# Patient Record
Sex: Female | Born: 1955 | Smoking: Current every day smoker
Health system: Southern US, Community
[De-identification: ages and names within clinical notes are randomized; demographics above are authoritative.]

## PROBLEM LIST (undated history)

## (undated) DIAGNOSIS — R296 Repeated falls: Secondary | ICD-10-CM

## (undated) DIAGNOSIS — I10 Essential (primary) hypertension: Secondary | ICD-10-CM

## (undated) DIAGNOSIS — B20 Human immunodeficiency virus [HIV] disease: Secondary | ICD-10-CM

## (undated) DIAGNOSIS — E039 Hypothyroidism, unspecified: Secondary | ICD-10-CM

## (undated) DIAGNOSIS — I739 Peripheral vascular disease, unspecified: Secondary | ICD-10-CM

## (undated) DIAGNOSIS — R5381 Other malaise: Secondary | ICD-10-CM

## (undated) HISTORY — DX: Peripheral vascular disease, unspecified: I73.9

## (undated) HISTORY — DX: Hypothyroidism, unspecified: E03.9

## (undated) HISTORY — DX: Human immunodeficiency virus (HIV) disease: B20

## (undated) HISTORY — DX: Other malaise: R53.81

## (undated) HISTORY — PX: CEREBRAL ANEURYSM REPAIR: SHX164

## (undated) HISTORY — DX: Repeated falls: R29.6

## (undated) HISTORY — DX: Essential (primary) hypertension: I10

---

## 2016-10-09 LAB — PROTIME-INR: PROTIME: 14.1 — AB (ref 10.0–13.8)

## 2016-10-09 LAB — POCT INR: INR: 1.1 (ref 0.9–1.1)

## 2016-11-27 LAB — BASIC METABOLIC PANEL WITH GFR
BUN: 14 (ref 4–21)
Creatinine: 0.5 (ref 0.5–1.1)
Potassium: 4.1 (ref 3.4–5.3)
Sodium: 140 (ref 137–147)

## 2016-11-29 LAB — HEPATIC FUNCTION PANEL
ALT: 16 (ref 7–35)
AST: 21 (ref 13–35)
Alkaline Phosphatase: 60 (ref 25–125)
BILIRUBIN, TOTAL: 0.3

## 2016-11-29 LAB — CBC AND DIFFERENTIAL
HCT: 43 (ref 36–46)
Hemoglobin: 14.6 (ref 12.0–16.0)
PLATELETS: 162 (ref 150–399)
WBC: 7.5

## 2016-12-03 ENCOUNTER — Non-Acute Institutional Stay (SKILLED_NURSING_FACILITY): Payer: Medicare HMO | Admitting: Internal Medicine

## 2016-12-03 ENCOUNTER — Encounter: Payer: Self-pay | Admitting: Internal Medicine

## 2016-12-03 DIAGNOSIS — I10 Essential (primary) hypertension: Secondary | ICD-10-CM

## 2016-12-03 DIAGNOSIS — S93401D Sprain of unspecified ligament of right ankle, subsequent encounter: Secondary | ICD-10-CM | POA: Diagnosis not present

## 2016-12-03 DIAGNOSIS — E8809 Other disorders of plasma-protein metabolism, not elsewhere classified: Secondary | ICD-10-CM

## 2016-12-03 DIAGNOSIS — S93401A Sprain of unspecified ligament of right ankle, initial encounter: Secondary | ICD-10-CM | POA: Insufficient documentation

## 2016-12-03 DIAGNOSIS — G6289 Other specified polyneuropathies: Secondary | ICD-10-CM | POA: Insufficient documentation

## 2016-12-03 DIAGNOSIS — B20 Human immunodeficiency virus [HIV] disease: Secondary | ICD-10-CM | POA: Diagnosis not present

## 2016-12-03 DIAGNOSIS — E039 Hypothyroidism, unspecified: Secondary | ICD-10-CM | POA: Insufficient documentation

## 2016-12-03 NOTE — Assessment & Plan Note (Addendum)
Ambulate with walker as precaution PT/OT for strength & balance training

## 2016-12-03 NOTE — Progress Notes (Signed)
NURSING HOME LOCATION:  [facility] ROOM NUMBER:  224-A  CODE STATUS:  Full Code  PCP:  No primary care provider on file.    This is a comprehensive admission note to [facility] performed on this date less than 30 days from date of admission. Included are preadmission medical/surgical history;reconciled medication list; family history; social history and comprehensive review of systems.  Corrections and additions to the records were documented . Comprehensive physical exam was also performed. Additionally a clinical summary was entered for each active diagnosis pertinent to this admission in the Problem List to enhance continuity of care.  HPI: The patient was hospitalized 7/31-11/29/16 after mechanical fall described as " foot gave out" with subsequent pain  in the right foot and ankle pain precluding ambulation. She denies any cardiac or neurologic prodrome. She states that she was actually reaching for her walker when her leg slipped out from under her.  As she must employ a walker at home and her husband must use a cane for ambulation, she was admitted for evaluation. No fracture or dislocation was found on imaging, diffuse osteopenia was present.Clinically only strain was present. PT/OT evaluated the patient and she was gradually mobilkized with a walker. Patient has a diagnosis of axonal neuropathy;Neurology recommended continuing the PT. The etiology of the neuropathy was possibly due to HIV, idiopathic, or diabetes as per neurology.  ACE inhibitor and HCTZ was continued as BP was stable. TSH was therapeutic and no change was made in her L-thyroxine. The patient had an episode of fever;but blood cultures were negative ,white count & UA normal. and chest x-ray revealed no acute process. Chest x-ray did reveal calcified granulomata at the right apex, elevation of left hemidiaphragm with basilar scarring, and chronic interstitial changes which were stable compared to prior  films.ID was to follow the patient as outpatient for HIV .Abnormal labs included a creatinine of 0.45, albumin 3.3 Protein supplement was recommended twice a day for 30 days.  Past medical and surgical history: Includes hypothyroidism, hypertension, osteoarthritis, osteoporosis, HIV, gait instabity with recurrent falls. No surgical history on file,but she states she had ' fluid drawn from her kidney 'during which she had injury to the rib. The details are vague at best. She also states that she had surgery for CNS aneurysm in 1994.This hx should be verified before Epic entry.  Social history: She was smoking a half a pack per day prior to admission. She does not drink.  Family history: Father had diabetes, heart attack, and stroke. Mother had diabetes. Father also had cancer of the extremity.  Review of systems: At this time she describes only residual soreness in the ankle and has been ambulating in PT/OT. She describes intermittent frontal headaches which she feels may be related to needing eyeglasses. She is not treating this with any medication. She does describe occasional heartburn and occasional dysphagia. Additionally she has occasional or intermittent dysuria.  Constitutional: No fever,significant weight change  Eyes: No redness, discharge, pain, vision change ENT/mouth: No nasal congestion,  purulent discharge, earache,change in hearing ,sore throat  Cardiovascular: No chest pain, palpitations,paroxysmal nocturnal dyspnea, claudication, edema  Respiratory: No cough, sputum production,hemoptysis, DOE , significant snoring,apnea  Gastrointestinal: No abdominal pain, nausea / vomiting,rectal bleeding, melena,change in bowels Genitourinary: No hematuria, pyuria,  incontinence, nocturia Dermatologic: No rash, pruritus, change in appearance of skin Neurologic: No dizziness,syncope, seizures, numbness , tingling Psychiatric: No significant anxiety , depression, insomnia, anorexia Endocrine: No  change in hair/skin/ nails, excessive thirst,  excessive hunger, excessive urination  Hematologic/lymphatic: No significant bruising, lymphadenopathy,abnormal bleeding Allergy/immunology: No itchy/ watery eyes, significant sneezing, urticaria, angioedema  Physical exam:  Pertinent or positive findings: Weight appears suboptimal. She has temporal wasting. Eyebrows are absent. There is slight exotropia of the right eye at rest. She is edentulous. She has bilateral carotid bruits. There is a grade 1 systolic murmur at the base. Toenails are thickened. Pedal pulses are decreased.  No significant pain with ROM of R ankle.   General appearance: no acute distress , increased work of breathing is present.   Lymphatic: No lymphadenopathy about the head, neck, axilla . Eyes: No conjunctival inflammation or lid edema is present. There is no scleral icterus. Ears:  External ear exam shows no significant lesions or deformities.   Nose:  External nasal examination shows no deformity or inflammation. Nasal mucosa are pink and moist without lesions ,exudates Oral exam: lips and gums are healthy appearing.There is no oropharyngeal erythema or exudate . Neck:  No thyromegaly, masses, tenderness noted.    Heart:  Normal rate and regular rhythm. S1 and S2 normal without gallop,  click, rub .  Lungs:Chest clear to auscultation without wheezes, rhonchi,rales , rubs. Abdomen:Bowel sounds are normal. Abdomen is soft and nontender with no organomegaly, hernias,masses. GU: deferred  Extremities:  No cyanosis, clubbing,edema.Neurologic exam : Strength equal  in upper & lower extremities Balance,Rhomberg,finger to nose testing could not be completed due to clinical state Deep tendon reflexes are equal Skin: Warm & dry w/o tenting. No significant lesions or rash.  See clinical summary under each active problem in the Problem List with associated updated therapeutic plan

## 2016-12-03 NOTE — Assessment & Plan Note (Signed)
As per ID

## 2016-12-03 NOTE — Assessment & Plan Note (Signed)
Nutritional supplement twice a day 30 days

## 2016-12-03 NOTE — Assessment & Plan Note (Signed)
BP controlled; no change in antihypertensive medications  

## 2016-12-03 NOTE — Assessment & Plan Note (Addendum)
Continue PT/OT Wean & discontinue the opioids due to the subjective improvement If pain or swelling recur, orthopedic consultation

## 2016-12-04 ENCOUNTER — Encounter: Payer: Self-pay | Admitting: Internal Medicine

## 2016-12-04 NOTE — Assessment & Plan Note (Signed)
TSH therapeutic

## 2016-12-04 NOTE — Patient Instructions (Signed)
See assessment and plan under each diagnosis in the problem list and acutely for this visit 

## 2016-12-12 ENCOUNTER — Encounter: Payer: Self-pay | Admitting: Adult Health

## 2016-12-12 ENCOUNTER — Non-Acute Institutional Stay (SKILLED_NURSING_FACILITY): Payer: Medicare HMO | Admitting: Adult Health

## 2016-12-12 DIAGNOSIS — B20 Human immunodeficiency virus [HIV] disease: Secondary | ICD-10-CM

## 2016-12-12 DIAGNOSIS — R2681 Unsteadiness on feet: Secondary | ICD-10-CM

## 2016-12-12 DIAGNOSIS — S93401D Sprain of unspecified ligament of right ankle, subsequent encounter: Secondary | ICD-10-CM

## 2016-12-12 DIAGNOSIS — E039 Hypothyroidism, unspecified: Secondary | ICD-10-CM

## 2016-12-12 DIAGNOSIS — I10 Essential (primary) hypertension: Secondary | ICD-10-CM

## 2016-12-12 MED ORDER — ALENDRONATE SODIUM 70 MG PO TABS: 70.0000 mg | ORAL_TABLET | ORAL | 0 refills | Status: DC

## 2016-12-12 MED ORDER — DICLOFENAC SODIUM 75 MG PO TBEC: 75.0000 mg | DELAYED_RELEASE_TABLET | Freq: Two times a day (BID) | ORAL | 0 refills | Status: DC

## 2016-12-12 MED ORDER — ERGOCALCIFEROL 1.25 MG (50000 UT) PO CAPS: 50000.0000 [IU] | ORAL_CAPSULE | ORAL | 0 refills | Status: DC

## 2016-12-12 MED ORDER — LEVOTHYROXINE SODIUM 75 MCG PO TABS: 75.0000 ug | ORAL_TABLET | Freq: Every day | ORAL | 0 refills | Status: DC

## 2016-12-12 MED ORDER — EMTRICITAB-RILPIVIR-TENOFOV AF 200-25-25 MG PO TABS: 1.0000 | ORAL_TABLET | Freq: Every day | ORAL | 0 refills | Status: AC

## 2016-12-12 MED ORDER — VALACYCLOVIR HCL 500 MG PO TABS: 1000.0000 mg | ORAL_TABLET | Freq: Every day | ORAL | 0 refills | Status: DC

## 2016-12-12 MED ORDER — LISINOPRIL-HYDROCHLOROTHIAZIDE 20-25 MG PO TABS: 1.0000 | ORAL_TABLET | Freq: Every day | ORAL | 0 refills | Status: DC

## 2016-12-12 MED ORDER — DOLUTEGRAVIR SODIUM 50 MG PO TABS: 50.0000 mg | ORAL_TABLET | Freq: Every day | ORAL | 0 refills | Status: AC

## 2016-12-12 MED ORDER — METOPROLOL TARTRATE 25 MG PO TABS: 25.0000 mg | ORAL_TABLET | Freq: Every day | ORAL | 0 refills | Status: DC

## 2016-12-12 NOTE — Progress Notes (Signed)
DATE:  12/12/2016   MRN:  [id number]  BIRTHDAY: [birth year]  Facility:  Nursing Home Location:  [facility] Room Number: 224-A  LEVEL OF CARE:  SNF (31)  Contact Information    Name Relation Home Work Mobile   Phifer,Simon Spouse [phone]         Code Status History    This patient does not have a recorded code status. Please follow your organizational policy for patients in this situation.       Chief Complaint  Patient presents with  . Discharge Note    Discharge visit    HISTORY OF PRESENT ILLNESS:  This is a 60-YO female seen for discharge.  She will discharge to home on 12/13/2016 with home health PT, OT, ST, Social Work and Nursing services.    She has been admitted to [facility] 11/[facility]/16 [facility] admission admission dates 11/26/16 to 11/29/16 for S/P fall sustaining Right ankle sprain. 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   Patient was admitted to this facility for short-term rehabilitation after the patient's recent hospitalization.  Patient has completed SNF rehabilitation and therapy has cleared the patient for discharge.    PAST MEDICAL HISTORY:  Past Medical History:  Diagnosis Date  . Acquired hypothyroidism   . Essential hypertension   . Frequent falls   . HIV (human immunodeficiency virus infection) (HCC)   . Physical debility   . PVD (peripheral vascular disease) (HCC)      CURRENT MEDICATIONS: Reviewed  Patient's Medications  New Prescriptions   No medications on file  Previous Medications   ALENDRONATE (FOSAMAX) 70 MG TABLET    Take 70 mg by mouth once a week. Take with a full glass of water on an empty stomach.   ASPIRIN 81 MG CHEWABLE TABLET    Chew 81 mg by mouth daily.   BUTALBITAL-ACETAMINOPHEN-CAFFEINE (FIORICET, ESGIC) 50-325-40 MG TABLET    Take 1 tablet by mouth every 4 (four) hours as needed for headache.   DICLOFENAC  (VOLTAREN) 75 MG EC TABLET    Take 75 mg by mouth 2 (two) times daily.   DOLUTEGRAVIR (TIVICAY) 50 MG TABLET    Take 50 mg by mouth daily.   EMTRICITABINE-RILPIVIR-TENOFOVIR AF (ODEFSEY) 200-25-25 MG TABS TABLET    Take 1 tablet by mouth daily with breakfast.   ERGOCALCIFEROL (DRISDOL) 50000 UNITS CAPSULE    Take 50,000 Units by mouth every 30 (thirty) days.   LEVOTHYROXINE (SYNTHROID, LEVOTHROID) 75 MCG TABLET    Take 75 mcg by mouth daily before breakfast.   LISINOPRIL-HYDROCHLOROTHIAZIDE (PRINZIDE,ZESTORETIC) 20-25 MG TABLET    Take 1 tablet by mouth daily.   METOPROLOL TARTRATE (LOPRESSOR) 25 MG TABLET    Take 25 mg by mouth daily.   NUTRITIONAL SUPPLEMENT LIQD    Take 120 mLs by mouth 2 (two) times daily. MedPass   OXYCODONE (OXY-IR) 5 MG CAPSULE    Take 5 mg by mouth. Take one tablet q8h prn thru 12/15/16, take 1 tablet q12h 8/20-8/25 and then discontinue   VALACYCLOVIR (VALTREX) 500 MG TABLET    Take 1,000 mg by mouth daily. Take 2 tablets to = 1000 mg  Modified Medications   No medications on file  Discontinued Medications   No medications on file     Allergies  Allergen Reactions  . Penicillins   . Sulfa Antibiotics     Sulfonamide antibiotics  REVIEW OF SYSTEMS:  GENERAL: no change in appetite, no fatigue, no weight changes, no fever, chills or weakness MOUTH and THROAT: Denies oral discomfort, gingival pain or bleeding, pain from teeth or hoarseness   RESPIRATORY: no cough, SOB, DOE, wheezing, hemoptysis CARDIAC: no chest pain, edema or palpitations GI: no abdominal pain, diarrhea, constipation, heart burn, nausea or vomiting PSYCHIATRIC: Denies feeling of depression or anxiety. No report of hallucinations, insomnia, paranoia, or agitation    PHYSICAL EXAMINATION  GENERAL APPEARANCE:  In no acute distress. Normal body habitus SKIN:  Skin is warm and dry.  HEAD: Normal in size and contour. No evidence of trauma EYES: Lids open and close normally. No blepharitis,  entropion or ectropion.  MOUTH and THROAT: Lips are without lesions. Oral mucosa is moist and without lesions. Tongue is normal in shape, size, and color and without lesions RESPIRATORY: breathing is even & unlabored, BS CTAB CARDIAC: RRR, no edema GI: abdomen soft, normal BS, no masses, no tenderness EXTREMITIES:   Able to move X 4 extremities PSYCHIATRIC: Alert to self and place, disoriented to time. Affect and behavior are appropriate   LABS/RADIOLOGY: Labs reviewed: Basic Metabolic Panel:  Recent Labs  16/10/96  NA 140  K 4.1  BUN 14  CREATININE 0.5   Liver Function Tests:  Recent Labs  11/29/16  AST 21  ALT 16  ALKPHOS 60   CBC:  Recent Labs  11/29/16  WBC 7.5  HGB 14.6  HCT 43  PLT 162     ASSESSMENT/PLAN:  1. Unsteady gait -  For home health PT and OT, for therapeutic strengthening exercises; fall precautions   2. Sprain of right ankle, unspecified ligament, subsequent encounter - For home health PT and OT, for therapeutic strengthening exercises;  continue oxycodone 5 mg 1 tab by mouth every 8 hours when necessary for pain, follow-up with orthopedics   3. HIV (human immunodeficiency virus infection) (HCC) - continue Tivicay, Odefsey and Valacyclovir, follow-up with PCP   4. Acquired hypothyroidism -  Continue levothyroxine 75 g 1 tab daily   5. Essential hypertension -  Well controlled; continue lisinopril-HCTZ 20-25 mg 1 tab daily     I have filled out patient's discharge paperwork and written prescriptions.  Patient will receive home health PT, OT, ST, Nursing and Child psychotherapist.  DME provided: None  Total discharge time: Greater than 30 minutes  150% was spent in counseling and coordination of care.   Discharge time involved coordination of the discharge process with social worker, nursing staff and therapy department. Medical justification for home health services verified.    Gabriana Wilmott C. Medina-Vargas - NP   [employer] [phone]

## 2017-06-03 ENCOUNTER — Other Ambulatory Visit: Payer: Self-pay | Admitting: Adult Health

## 2017-06-11 ENCOUNTER — Other Ambulatory Visit: Payer: Self-pay

## 2017-06-11 ENCOUNTER — Emergency Department (HOSPITAL_BASED_OUTPATIENT_CLINIC_OR_DEPARTMENT_OTHER): Payer: Medicare HMO

## 2017-06-11 ENCOUNTER — Emergency Department (HOSPITAL_BASED_OUTPATIENT_CLINIC_OR_DEPARTMENT_OTHER)
Admission: EM | Admit: 2017-06-11 | Discharge: 2017-06-11 | Disposition: A | Discharge status: Home / Self Care | Payer: Medicare HMO | Attending: Emergency Medicine | Admitting: Emergency Medicine

## 2017-06-11 ENCOUNTER — Encounter (HOSPITAL_BASED_OUTPATIENT_CLINIC_OR_DEPARTMENT_OTHER): Payer: Self-pay | Admitting: Emergency Medicine

## 2017-06-11 DIAGNOSIS — Z79899 Other long term (current) drug therapy: Secondary | ICD-10-CM | POA: Insufficient documentation

## 2017-06-11 DIAGNOSIS — W19XXXA Unspecified fall, initial encounter: Secondary | ICD-10-CM

## 2017-06-11 DIAGNOSIS — Z21 Asymptomatic human immunodeficiency virus [HIV] infection status: Secondary | ICD-10-CM | POA: Insufficient documentation

## 2017-06-11 DIAGNOSIS — F17210 Nicotine dependence, cigarettes, uncomplicated: Secondary | ICD-10-CM | POA: Diagnosis not present

## 2017-06-11 DIAGNOSIS — M25552 Pain in left hip: Secondary | ICD-10-CM | POA: Diagnosis present

## 2017-06-11 DIAGNOSIS — Z7982 Long term (current) use of aspirin: Secondary | ICD-10-CM | POA: Diagnosis not present

## 2017-06-11 DIAGNOSIS — E039 Hypothyroidism, unspecified: Secondary | ICD-10-CM | POA: Diagnosis not present

## 2017-06-11 DIAGNOSIS — M25562 Pain in left knee: Secondary | ICD-10-CM | POA: Diagnosis not present

## 2017-06-11 DIAGNOSIS — I10 Essential (primary) hypertension: Secondary | ICD-10-CM | POA: Diagnosis not present

## 2017-06-11 DIAGNOSIS — W01198A Fall on same level from slipping, tripping and stumbling with subsequent striking against other object, initial encounter: Secondary | ICD-10-CM | POA: Diagnosis not present

## 2017-06-11 MED ORDER — ACETAMINOPHEN ER 650 MG PO TBCR: 650.0000 mg | EXTENDED_RELEASE_TABLET | Freq: Three times a day (TID) | ORAL | 0 refills | Status: DC | PRN

## 2017-06-11 MED ORDER — ACETAMINOPHEN 325 MG PO TABS
650.0000 mg | ORAL_TABLET | Freq: Once | ORAL | Status: AC
Start: 2017-06-11 — End: 2017-06-11
  Administered 2017-06-11: 650 mg via ORAL
  Filled 2017-06-11: qty 2

## 2017-06-11 NOTE — ED Triage Notes (Signed)
Pt to ED via EMS with c/o LT knee pain s/p fall at home

## 2017-06-11 NOTE — ED Notes (Signed)
Pt able to ambulate with walker. PA aware. Pt ready for discharge.

## 2017-06-11 NOTE — ED Provider Notes (Signed)
[facility] EMERGENCY DEPARTMENT Provider Note   CSN: [id number] Arrival date & time: 06/11/17  1639     History   Chief Complaint Chief Complaint  Patient presents with  . Knee Pain    HPI [name] is a 62 y.o. female with a hx of HIV, hypothyroidism, HTN, PVD, and physical debility who arrives to the ED via EMS Sonya Le that occurred at 1400 complaining of LLE pain most prominently in the hip and knee. Patient states she was getting off of the commode and pulling her pants up when she tripped over the pant leg causing her to Le onto the L knee. States she did not have head injury or LOC. Reports she needed her husband's assistance to get up. She has not ambulated since the Le. She walks with a walker at baseline. States pain at present is a 10/10 in severity, worse with movement, no alleviating factors, tried advil. Denies numbness, weakness, neck pain, or back pain. Denies lightheadedness, dizziness, chest pain, or dyspnea prior to Le or at present.   HPI  Past Medical History:  Diagnosis Date  . Acquired hypothyroidism   . Essential hypertension   . Frequent falls   . HIV (human immunodeficiency virus infection) (HCC)   . Physical debility   . PVD (peripheral vascular disease) [facility])     Patient Active Problem List   Diagnosis Date Noted  . Right ankle sprain 12/03/2016  . 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 Hypothyroidism 12/03/2016  . Essential hypertension 12/03/2016  . HIV (human immunodeficiency virus infection) (HCC) 12/03/2016  . Hypoalbuminemia 12/03/2016    Past Surgical History:  Procedure Laterality Date  . CEREBRAL ANEURYSM REPAIR      OB History    No data available       Home Medications    Prior to Admission medications   Medication Sig Start Date End Date Taking? Authorizing Provider  alendronate (FOSAMAX) 70 MG tablet Take 1 tablet (70 mg total) by mouth once a week. Take with a full glass of water on an empty  stomach. 12/12/16   Medina-Vargas, Monina C, NP  aspirin 81 MG chewable tablet Chew 81 mg by mouth daily.    [provider]  butalbital-acetaminophen-caffeine (FIORICET, ESGIC) 50-325-40 MG tablet Take 1 tablet by mouth every 4 (four) hours as needed for headache.    [provider]  diclofenac (VOLTAREN) 75 MG EC tablet Take 1 tablet (75 mg total) by mouth 2 (two) times daily. 12/12/16   Medina-Vargas, Monina C, NP  dolutegravir (TIVICAY) 50 MG tablet Take 1 tablet (50 mg total) by mouth daily. 12/12/16   Medina-Vargas, Monina C, NP  emtricitabine-rilpivir-tenofovir AF (ODEFSEY) 200-25-25 MG TABS tablet Take 1 tablet by mouth daily with breakfast. 12/12/16   Medina-Vargas, Monina C, NP  ergocalciferol (DRISDOL) 50000 units capsule Take 1 capsule (50,000 Units total) by mouth every 30 (thirty) days. 12/12/16   Medina-Vargas, Monina C, NP  levothyroxine (SYNTHROID, LEVOTHROID) 75 MCG tablet Take 1 tablet (75 mcg total) by mouth daily before breakfast. 12/12/16   Medina-Vargas, Monina C, NP  lisinopril-hydrochlorothiazide (PRINZIDE,ZESTORETIC) 20-25 MG tablet Take 1 tablet by mouth daily. 12/12/16   Medina-Vargas, Monina C, NP  metoprolol tartrate (LOPRESSOR) 25 MG tablet Take 1 tablet (25 mg total) by mouth daily. 12/12/16   Medina-Vargas, Monina C, NP  NUTRITIONAL SUPPLEMENT LIQD Take 120 mLs by mouth 2 (two) times daily. MedPass    [provider]  oxycodone (OXY-IR) 5 MG capsule Take  5 mg by mouth. Take one tablet q8h prn thru 12/15/16, take 1 tablet q12h 8/20-8/25 and then discontinue 12/10/16   [provider]  valACYclovir (VALTREX) 500 MG tablet Take 2 tablets (1,000 mg total) by mouth daily. Take 2 tablets to = 1000 mg 12/12/16   Medina-Vargas, Monina C, NP    Family History Family History  Problem Relation Age of Onset  . Diabetes Mother   . Diabetes Father   . Cancer Father   . Stroke Father   . Heart disease Father     Social History Social History    Tobacco Use  . Smoking status: Current Every Day Smoker    Packs/day: 0.50    Types: Cigarettes  . Smokeless tobacco: Never Used  Substance Use Topics  . Alcohol use: No  . Drug use: No     Allergies   Penicillins and Sulfa antibiotics   Review of Systems Review of Systems  Respiratory: Negative for shortness of breath.   Cardiovascular: Negative for chest pain and leg swelling.  Musculoskeletal: Positive for arthralgias (L hip and L knee) and myalgias. Negative for back pain and neck pain.  Neurological: Negative for dizziness, weakness, light-headedness, numbness and headaches.  All other systems reviewed and are negative.  Physical Exam Updated Vital Signs BP (!) 174/70   Pulse 82   Temp 98.2 F (36.8 C) (Oral)   Resp 17   Ht 5' (1.524 m)   Wt 58.5 kg (129 lb)   SpO2 100%   BMI 25.19 kg/m   Physical Exam  Constitutional: No distress.  Thin appearing.   HENT:  Head: Normocephalic and atraumatic. Head is without raccoon's eyes and without Battle's sign.  Right Ear: No hemotympanum.  Left Ear: No hemotympanum.  Mouth/Throat: Uvula is midline and oropharynx is clear and moist.  Eyes: Conjunctivae and EOM are normal. Pupils are equal, round, and reactive to light. Right eye exhibits no discharge. Left eye exhibits no discharge.  Neck: No spinous process tenderness present.  Cardiovascular: Normal rate and regular rhythm.  No murmur heard. Pulses:      Dorsalis pedis pulses are 2+ on the right side, and 2+ on the left side.  Pulmonary/Chest: Breath sounds normal. No respiratory distress. She has no wheezes. She has no rales.  Abdominal: Soft. She exhibits no distension. There is no tenderness.  Musculoskeletal:  Back: No midline tenderness. Upper extremities: Full range of motion at all joints, no bony tenderness. Lower extremities: No obvious deformity, appreciable swelling, ecchymosis, or erythema.  Range of motion to the left hip and knee is limited  secondary to pain.  She is diffusely tender to the left hip, femur, knee, and lower leg region.  There is no point tenderness.   Neurological:  Sensation grossly intact to bilateral lower extremities.  5 out of 5 strength with right lower extremity plantar and dorsiflexion, 4 out of 5 strength with left lower extremity plantar and dorsiflexion-patient states difficult secondary to pain.    Skin: Skin is warm and dry. No rash noted.  Psychiatric: She has a normal mood and affect. Her behavior is normal.  Nursing note and vitals reviewed.  ED Treatments / Results  Labs (all labs ordered are listed, but only abnormal results are displayed) Labs Reviewed - No data to display  EKG  EKG Interpretation None       Radiology Dg Tibia/fibula Left  Result Date: 06/11/2017 CLINICAL DATA:  Le today LEFT hip pain EXAM: LEFT TIBIA AND FIBULA -  2 VIEW COMPARISON:  None. FINDINGS: No evidence of tibial fracture. The ankle joint knee joint appear normal. On the lateral projection there is a indentation along the anterior cortex of the distal fibula above the ankle mortise. IMPRESSION: Questionable cortical irregularity distal fibula. Recommend correlation with point tenderness and consider dedicated ankle films. Electronically Signed   By: Genevive BiStewart  Edmunds M.D.   On: 06/11/2017 19:15   Dg Ankle Complete Left  Result Date: 06/11/2017 CLINICAL DATA:  Le, pain. EXAM: LEFT ANKLE COMPLETE - 3+ VIEW COMPARISON:  Tibia performed earlier today. FINDINGS: No visible acute fracture. Anterior cortical defect in the fibula, likely sequelae of old trauma. Nonaggressive features. IMPRESSION: No acute fracture. Electronically Signed   By: Elsie StainJohn T Curnes M.D.   On: 06/11/2017 20:52   Dg Knee Complete 4 Views Left  Result Date: 06/11/2017 CLINICAL DATA:  Le x today. Lt knee pain entirely with some swelling. No old injury known. EXAM: LEFT KNEE - COMPLETE 4+ VIEW COMPARISON:  None. FINDINGS: No fracture of the  proximal tibia or distal femur. Patella is normal. No joint effusion. IMPRESSION: No fracture or dislocation. Electronically Signed   By: Genevive BiStewart  Edmunds M.D.   On: 06/11/2017 19:11   Dg Hip Unilat With Pelvis 2-3 Views Left  Result Date: 06/11/2017 CLINICAL DATA:  Le x today. Lt hip pain entirely no old injury known. EXAM: DG HIP (WITH OR WITHOUT PELVIS) 2-3V LEFT COMPARISON:  11/23/2015 FINDINGS: Hips are located. No evidence of pelvic fracture or sacral fracture. Dedicated view of the LEFT hip demonstrates no femoral neck fracture. IMPRESSION: No pelvic fracture or LEFT hip fracture. Electronically Signed   By: Genevive BiStewart  Edmunds M.D.   On: 06/11/2017 19:10   Dg Femur Min 2 Views Left  Result Date: 06/11/2017 CLINICAL DATA:  LEFT femur pain EXAM: LEFT FEMUR 2 VIEWS COMPARISON:  None. FINDINGS: No fracture or dislocation of the femur. Knee joint and hip joint are normal IMPRESSION: No fracture or dislocation. Electronically Signed   By: Genevive BiStewart  Edmunds M.D.   On: 06/11/2017 19:16    Procedures Procedures (including critical care time)  Medications Ordered in ED Medications  acetaminophen (TYLENOL) tablet 650 mg (650 mg Oral Given 06/11/17 1956)     Initial Impression / Assessment and Plan / ED Course  I have reviewed the triage vital signs and the nursing notes.  Pertinent labs & imaging results that were available during my care of the patient were reviewed by me and considered in my medical decision making (see chart for details).   Patient presents Sonya Le with LLE pain. Vitals WNL other than elevated BP- no indication of HTN emergency- discussed with the patient the need for recheck by PCP.  Patient without signs of serious head, neck, or back injury. She had no head injury or LOC. There is no midline spinal tenderness to palpation, doubt fracture or dislocation of the spine, doubt head bleed. Patient is diffusely tender to the entire LLE from hip to ankle, she is NVI  distally, some difficulty evaluating strength secondary to pain- will evaluate with radiographs.  Imaging reviewed and negative for acute abnormality- doubt fracture or dislocation to the LLE.   21:05: RE-EVAL Patient able to ambulate with walker which she uses at baseline. She is feeling improved following Tylenol. Will DC home with prescription for tylenol and instructions to continue prescribed Diclofenac.   I discussed results, treatment plan, need for PCP follow-up, and return precautions with the patient and her husband. Provided opportunity  for questions, patient and her husband confirmed understanding and are in agreement with plan.   Final Clinical Impressions(s) / ED Diagnoses   Final diagnoses:  Le, initial encounter    ED Discharge Orders        Ordered    acetaminophen (TYLENOL 8 HOUR) 650 MG CR tablet  Every 8 hours PRN     06/11/17 2108       Cherly Anderson, PA-C 06/11/17 2121    Charlynne Pander, MD 06/11/17 2237

## 2017-06-11 NOTE — Discharge Instructions (Signed)
You were seen in the emergency department today following a fall.  The x-rays of your left hip, femur, knee, lower leg, and ankle all did not show findings of a fracture or dislocation.  You were treated with Tylenol with improvement.  We have given you a prescription for Tylenol to take every 8 hours as needed.  Continue to take your prescribed diclofenac as well.  These will both help to help with your pain.  With your primary care provider for reevaluation after this fall as well as a recheck of your blood pressure in 1 week.  Your blood pressure was elevated in the emergency department today hence  why we would like it rechecked.  Return to the emergency department for any new or worsening symptoms including but not limited to numbness, weakness, or worsening pain, chest pain, difficulty breathing or any other concerns.

## 2017-06-27 ENCOUNTER — Other Ambulatory Visit: Payer: Self-pay | Admitting: Adult Health

## 2017-07-08 ENCOUNTER — Other Ambulatory Visit: Payer: Self-pay | Admitting: Adult Health

## 2017-08-29 ENCOUNTER — Other Ambulatory Visit: Payer: Self-pay | Admitting: Adult Health

## 2017-10-27 ENCOUNTER — Other Ambulatory Visit: Payer: Self-pay | Admitting: Adult Health

## 2017-11-03 ENCOUNTER — Other Ambulatory Visit: Payer: Self-pay | Admitting: Adult Health

## 2017-11-12 ENCOUNTER — Other Ambulatory Visit: Payer: Self-pay | Admitting: Adult Health

## 2017-12-01 ENCOUNTER — Other Ambulatory Visit: Payer: Self-pay | Admitting: Adult Health

## 2018-04-14 ENCOUNTER — Emergency Department (HOSPITAL_BASED_OUTPATIENT_CLINIC_OR_DEPARTMENT_OTHER): Payer: Medicare HMO

## 2018-04-14 ENCOUNTER — Other Ambulatory Visit: Payer: Self-pay

## 2018-04-14 ENCOUNTER — Encounter (HOSPITAL_BASED_OUTPATIENT_CLINIC_OR_DEPARTMENT_OTHER): Payer: Self-pay | Admitting: Emergency Medicine

## 2018-04-14 ENCOUNTER — Emergency Department (HOSPITAL_BASED_OUTPATIENT_CLINIC_OR_DEPARTMENT_OTHER)
Admission: EM | Admit: 2018-04-14 | Discharge: 2018-04-14 | Disposition: A | Discharge status: Home / Self Care | Payer: Medicare HMO | Attending: Emergency Medicine | Admitting: Emergency Medicine

## 2018-04-14 DIAGNOSIS — M25552 Pain in left hip: Secondary | ICD-10-CM | POA: Diagnosis not present

## 2018-04-14 DIAGNOSIS — E039 Hypothyroidism, unspecified: Secondary | ICD-10-CM | POA: Diagnosis not present

## 2018-04-14 DIAGNOSIS — W010XXA Fall on same level from slipping, tripping and stumbling without subsequent striking against object, initial encounter: Secondary | ICD-10-CM | POA: Insufficient documentation

## 2018-04-14 DIAGNOSIS — M25562 Pain in left knee: Secondary | ICD-10-CM | POA: Diagnosis present

## 2018-04-14 DIAGNOSIS — F17210 Nicotine dependence, cigarettes, uncomplicated: Secondary | ICD-10-CM | POA: Insufficient documentation

## 2018-04-14 DIAGNOSIS — Y929 Unspecified place or not applicable: Secondary | ICD-10-CM | POA: Diagnosis not present

## 2018-04-14 DIAGNOSIS — S0990XA Unspecified injury of head, initial encounter: Secondary | ICD-10-CM | POA: Diagnosis not present

## 2018-04-14 DIAGNOSIS — I10 Essential (primary) hypertension: Secondary | ICD-10-CM | POA: Insufficient documentation

## 2018-04-14 DIAGNOSIS — Y999 Unspecified external cause status: Secondary | ICD-10-CM | POA: Diagnosis not present

## 2018-04-14 DIAGNOSIS — S161XXA Strain of muscle, fascia and tendon at neck level, initial encounter: Secondary | ICD-10-CM | POA: Insufficient documentation

## 2018-04-14 DIAGNOSIS — W19XXXA Unspecified fall, initial encounter: Secondary | ICD-10-CM

## 2018-04-14 DIAGNOSIS — Y9389 Activity, other specified: Secondary | ICD-10-CM | POA: Insufficient documentation

## 2018-04-14 DIAGNOSIS — B20 Human immunodeficiency virus [HIV] disease: Secondary | ICD-10-CM | POA: Insufficient documentation

## 2018-04-14 MED ORDER — ACETAMINOPHEN 500 MG PO TABS
1000.0000 mg | ORAL_TABLET | Freq: Once | ORAL | Status: AC
  Administered 2018-04-14: 1000 mg via ORAL
  Filled 2018-04-14: qty 2

## 2018-04-14 MED ORDER — KETOROLAC TROMETHAMINE 30 MG/ML IJ SOLN
30.0000 mg | Freq: Once | INTRAMUSCULAR | Status: AC
  Administered 2018-04-14: 30 mg via INTRAMUSCULAR
  Filled 2018-04-14: qty 1

## 2018-04-14 NOTE — ED Provider Notes (Signed)
Emergency Department Provider Note   I have reviewed the triage vital signs and the nursing notes.   HISTORY  Chief Complaint Knee Pain   HPI Sonya Le is a 62 y.o. female with PMH of HTN, HIV, and PVD presents to the emergency department for evaluation after mechanical fall at home.  Patient was in her normal state of health when she was walking with her walker and it slipped from in front of her.  She fell forward with her left leg getting trapped underneath and falling behind.  She is unsure whether or not she struck her head and is complaining of headache at this time.  No loss of consciousness.  No near syncope symptoms prior to falling.  Denies any chest pain, shortness of breath, pain in the arms.  Most of her pain is in the left leg and behind the left knee.  He is also experiencing some left hip pain.  No significant pain in the right leg.  Patient lives at home with her husband.    Past Medical History:  Diagnosis Date  . Acquired hypothyroidism   . Essential hypertension   . Frequent falls   . HIV (human immunodeficiency virus infection) (HCC)   . Physical debility   . PVD (peripheral vascular disease) [facility])     Patient Active Problem List   Diagnosis Date Noted  . Right ankle sprain 12/03/2016  . 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 Hypothyroidism 12/03/2016  . Essential hypertension 12/03/2016  . HIV (human immunodeficiency virus infection) (HCC) 12/03/2016  . Hypoalbuminemia 12/03/2016    Past Surgical History:  Procedure Laterality Date  . CEREBRAL ANEURYSM REPAIR      Allergies Penicillins and Sulfa antibiotics  Family History  Problem Relation Age of Onset  . Diabetes Mother   . Diabetes Father   . Cancer Father   . Stroke Father   . Heart disease Father     Social History Social History   Tobacco Use  . Smoking status: Current Every Day Smoker    Packs/day: 0.50    Types: Cigarettes  . Smokeless tobacco: Never Used  Substance Use  Topics  . Alcohol use: No  . Drug use: No    Review of Systems  Constitutional: No fever/chills Eyes: No visual changes. ENT: No sore throat. Cardiovascular: Denies chest pain. Respiratory: Denies shortness of breath. Gastrointestinal: No abdominal pain.  No nausea, no vomiting.  No diarrhea.  No constipation. Genitourinary: Negative for dysuria. Musculoskeletal: Positive left leg pain.  Skin: Negative for rash. Neurological: Negative for focal weakness or numbness. Positive HA.   10-point ROS otherwise negative.  ____________________________________________   PHYSICAL EXAM:  VITAL SIGNS: ED Triage Vitals  Enc Vitals Group     BP 04/14/18 1845 (!) 159/63     Pulse Rate 04/14/18 1845 72     Resp 04/14/18 1845 18     Temp 04/14/18 1845 98.1 F (36.7 C)     Temp Source 04/14/18 1845 Oral     SpO2 04/14/18 1845 100 %     Weight 04/14/18 1846 129 lb (58.5 kg)     Height 04/14/18 1846 5\' 6"  (1.676 m)     Pain Score 04/14/18 1845 10   Constitutional: Alert and oriented. Well appearing and in no acute distress. Eyes: Conjunctivae are normal. Head: Atraumatic. Nose: No congestion/rhinnorhea. Mouth/Throat: Mucous membranes are moist.  Neck: No stridor. No cervical spine tenderness to palpation. Cardiovascular: Normal rate, regular rhythm. Good peripheral circulation. Grossly normal heart  sounds.   Respiratory: Normal respiratory effort.  No retractions. Lungs CTAB. Gastrointestinal: Soft and nontender. No distention.  Musculoskeletal: Pain with passive ROM of the left leg and tenderness behind the left knee. No joint edema or deformity. Normal ROM of the right lower extremity. Normal ROM of the bilateral upper extremities.  Neurologic:  Normal speech and language. No gross focal neurologic deficits are appreciated.  Skin:  Skin is warm, dry and intact. No rash noted.  ____________________________________________  RADIOLOGY  Ct Head Wo Contrast  Result Date:  04/14/2018 CLINICAL DATA:  Recent fall today with headaches and blurry vision EXAM: CT HEAD WITHOUT CONTRAST CT CERVICAL SPINE WITHOUT CONTRAST TECHNIQUE: Multidetector CT imaging of the head and cervical spine was performed following the standard protocol without intravenous contrast. Multiplanar CT image reconstructions of the cervical spine were also generated. COMPARISON:  12/31/2017 FINDINGS: CT HEAD FINDINGS Brain: Mild atrophic changes and chronic white matter ischemic changes are seen. No findings to suggest acute hemorrhage, acute infarction or space-occupying mass lesion are seen. Vascular: Changes of prior aneurysm clipping are noted on the left stable from the prior exam. Skull: Changes of prior craniotomy on the left are seen consistent with the aneurysm clipping. No acute abnormality in the skull is noted. Sinuses/Orbits: Mild fluid levels are noted within the paranasal sinuses stable from the previous exam. Other: None. CT CERVICAL SPINE FINDINGS Alignment: Within normal limits. Skull base and vertebrae: 7 cervical segments are well visualized. Vertebral body height is well maintained. No compression deformities are seen. Facet hypertrophic changes are noted. No acute fracture or acute facet abnormality is seen. Soft tissues and spinal canal: Vascular calcifications are noted. No gross soft tissue abnormality is identified. Upper chest: Scarring is noted in the upper chest bilaterally stable from previous exam. Other: None IMPRESSION: CT of the head: Chronic atrophic and ischemic changes. No acute abnormality is noted. Stable changes of prior aneurysm clipping near the terminal aspect of the left internal carotid artery. CT of the cervical spine: Multilevel degenerative change without acute abnormality. Electronically Signed   By: Alcide CleverMark  Lukens M.D.   On: 04/14/2018 20:47   Ct Cervical Spine Wo Contrast  Result Date: 04/14/2018 CLINICAL DATA:  Recent fall today with headaches and blurry vision  EXAM: CT HEAD WITHOUT CONTRAST CT CERVICAL SPINE WITHOUT CONTRAST TECHNIQUE: Multidetector CT imaging of the head and cervical spine was performed following the standard protocol without intravenous contrast. Multiplanar CT image reconstructions of the cervical spine were also generated. COMPARISON:  12/31/2017 FINDINGS: CT HEAD FINDINGS Brain: Mild atrophic changes and chronic white matter ischemic changes are seen. No findings to suggest acute hemorrhage, acute infarction or space-occupying mass lesion are seen. Vascular: Changes of prior aneurysm clipping are noted on the left stable from the prior exam. Skull: Changes of prior craniotomy on the left are seen consistent with the aneurysm clipping. No acute abnormality in the skull is noted. Sinuses/Orbits: Mild fluid levels are noted within the paranasal sinuses stable from the previous exam. Other: None. CT CERVICAL SPINE FINDINGS Alignment: Within normal limits. Skull base and vertebrae: 7 cervical segments are well visualized. Vertebral body height is well maintained. No compression deformities are seen. Facet hypertrophic changes are noted. No acute fracture or acute facet abnormality is seen. Soft tissues and spinal canal: Vascular calcifications are noted. No gross soft tissue abnormality is identified. Upper chest: Scarring is noted in the upper chest bilaterally stable from previous exam. Other: None IMPRESSION: CT of the head: Chronic atrophic and ischemic  changes. No acute abnormality is noted. Stable changes of prior aneurysm clipping near the terminal aspect of the left internal carotid artery. CT of the cervical spine: Multilevel degenerative change without acute abnormality. Electronically Signed   By: Alcide Clever M.D.   On: 04/14/2018 20:47   Dg Knee Complete 4 Views Left  Result Date: 04/14/2018 CLINICAL DATA:  Left leg/knee pain after fall today. EXAM: LEFT KNEE - COMPLETE 4+ VIEW COMPARISON:  Radiograph 12/31/2017 FINDINGS: No evidence of  fracture or dislocation. Trace joint effusion. Trace medial compartment spurring, mild for age. No evidence of arthropathy or other focal bone abnormality. Soft tissues are unremarkable. IMPRESSION: Trace joint effusion. No acute osseous abnormality. Electronically Signed   By: Narda Rutherford M.D.   On: 04/14/2018 21:15   Dg Hip Unilat W Or Wo Pelvis 2-3 Views Left  Result Date: 04/14/2018 CLINICAL DATA:  Left leg/knee pain after fall today. EXAM: DG HIP (WITH OR WITHOUT PELVIS) 2-3V LEFT COMPARISON:  Radiographs 06/11/2017 FINDINGS: The cortical margins of the bony pelvis and left hip are intact. No fracture. Pubic symphysis and sacroiliac joints are congruent. Both femoral heads are well-seated in the respective acetabula. IMPRESSION: No fracture or subluxation of the pelvis or left hip. Electronically Signed   By: Narda Rutherford M.D.   On: 04/14/2018 21:16    ____________________________________________   PROCEDURES  Procedure(s) performed:   Procedures  None ____________________________________________   INITIAL IMPRESSION / ASSESSMENT AND PLAN / ED COURSE  Pertinent labs & imaging results that were available during my care of the patient were reviewed by me and considered in my medical decision making (see chart for details).  Patient presents to the emergency department for evaluation after mechanical fall.  Most of her pain is in the left leg with some headache and neck discomfort as well.  Plan for CT imaging of the head and cervical spine along with plain film of the left hip and left knee.   CT scans and plain films reviewed. Patient pain improved with IM toradol here. Husband at bedside. Plan for discharge.  ____________________________________________  FINAL CLINICAL IMPRESSION(S) / ED DIAGNOSES  Final diagnoses:  Injury of head, initial encounter  Fall, initial encounter  Strain of neck muscle, initial encounter  Acute pain of left knee  Left hip pain      MEDICATIONS GIVEN DURING THIS VISIT:  Medications  acetaminophen (TYLENOL) tablet 1,000 mg (1,000 mg Oral Given 04/14/18 1925)  ketorolac (TORADOL) 30 MG/ML injection 30 mg (30 mg Intramuscular Given 04/14/18 2140)     Note:  This document was prepared using Dragon voice recognition software and may include unintentional dictation errors.  Alona Bene, MD Emergency Medicine    Ralphael Southgate, Arlyss Repress, MD 04/15/18 [phone]

## 2018-04-14 NOTE — ED Notes (Signed)
Pt is moderate fall risk. Pt states she has pain in both lower legs r/t fall this evening. Has injury to right knee. Pt states she fell when she walked out of her bathroom. She states her legs buckled under her. Pt states she ws walking with her walker. She fell backward onto her butt.

## 2018-04-14 NOTE — ED Triage Notes (Signed)
Pt presents via EMS with lower leg pain that pt states id r/t her legs "buckling when she was walking.

## 2018-04-14 NOTE — Discharge Instructions (Signed)

## 2019-10-02 IMAGING — CT CT CERVICAL SPINE W/O CM
4 of 7 series · 14 of 33 positions shown, 16 images · non-contrast
Comparison: 12/31/2017

CLINICAL DATA: Recent fall today with headaches and blurry vision

EXAM:
CT HEAD WITHOUT CONTRAST
CT CERVICAL SPINE WITHOUT CONTRAST
TECHNIQUE: Multidetector CT imaging of the head and cervical spine was
performed following the standard protocol without intravenous
contrast. Multiplanar CT image reconstructions of the cervical spine
were also generated.

[Series 4: head 3.0 mpr cor · coronal · 0.28mm/px · 2 of 67 slices shown]
[im 23/67  bone]
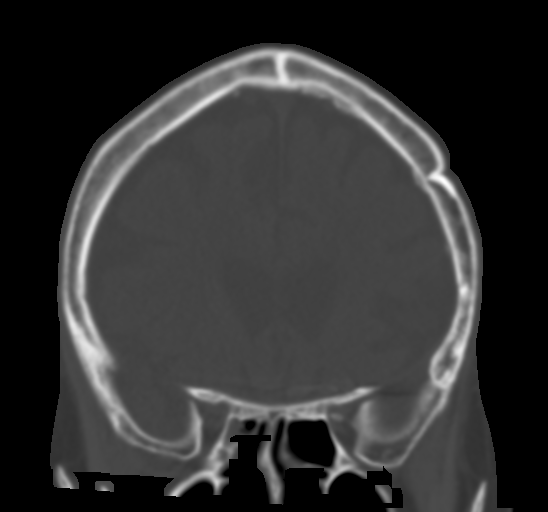
[im 45/67  bone]
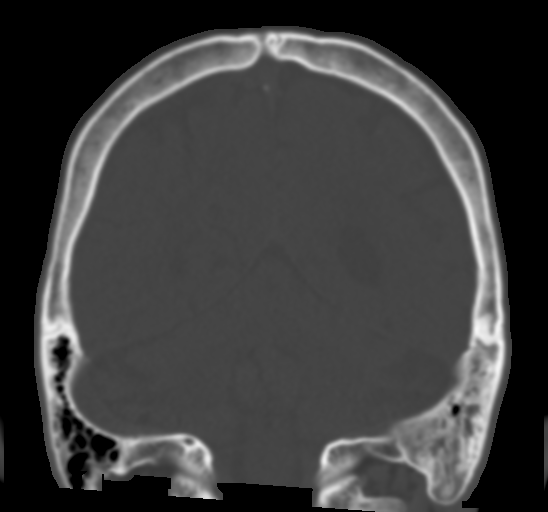

[Series 7: c_spine 2.0 i30s 3 · axial · 0.38mm/px · z∈[-518,-476]mm · 2 of 84 slices shown]
[im 21/84  bone]
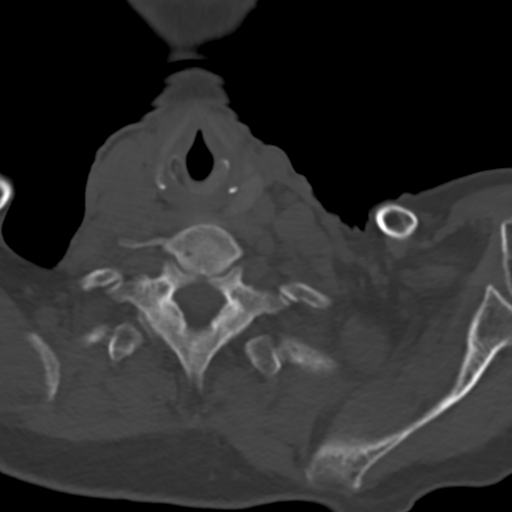
[im 42/84  bone]
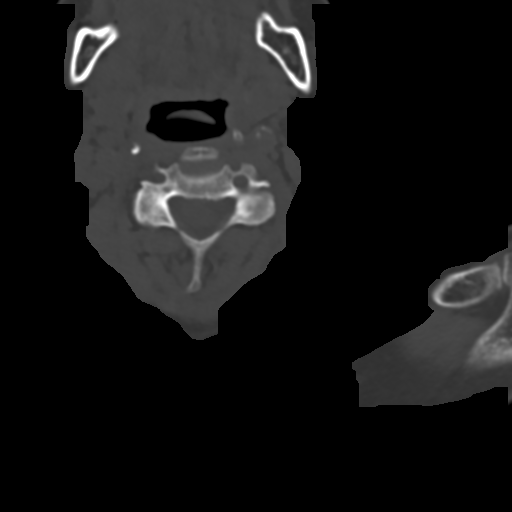

[Series 10: sagittals · sagittal · 0.31mm/px · 5 of 74 slices shown]
[im 11/74  bone]
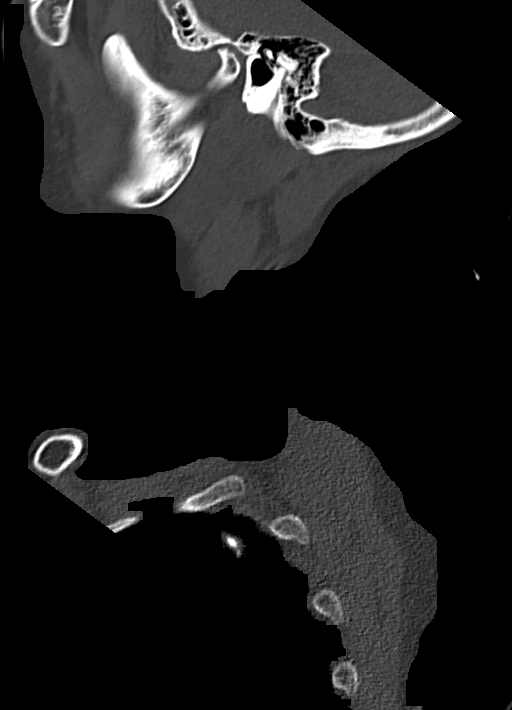
[im 21/74  bone]
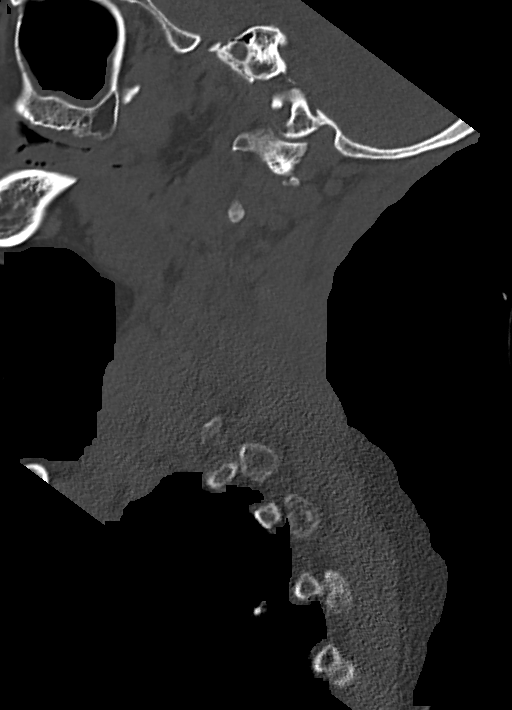
[im 32/74  bone]
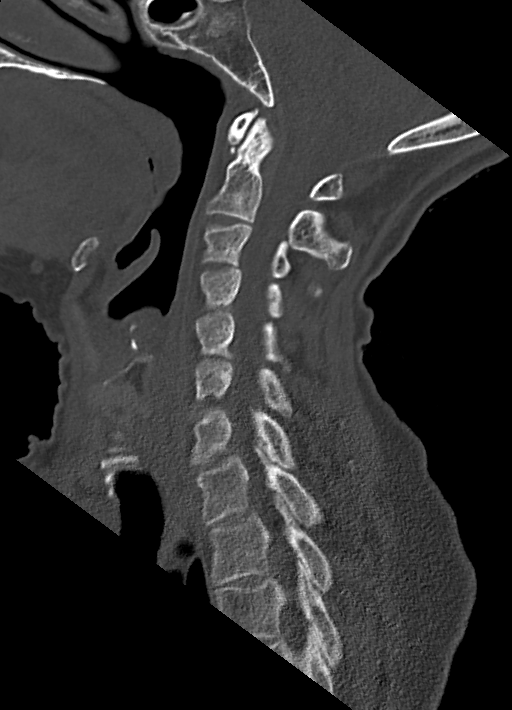
[im 42/74  bone]
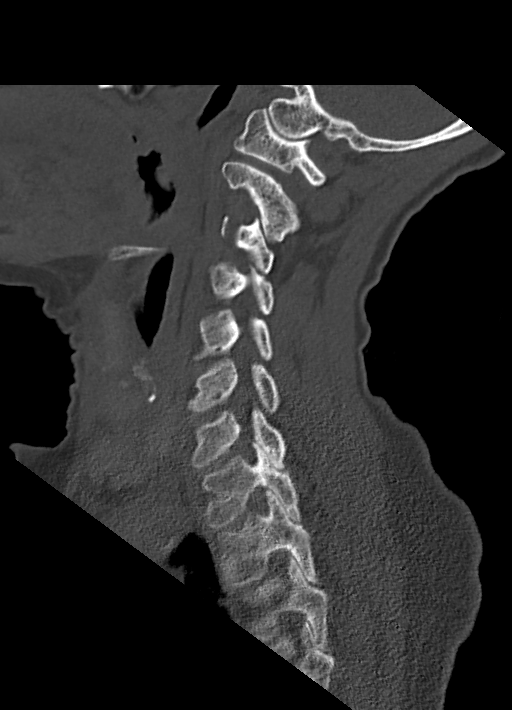
[im 53/74  bone]
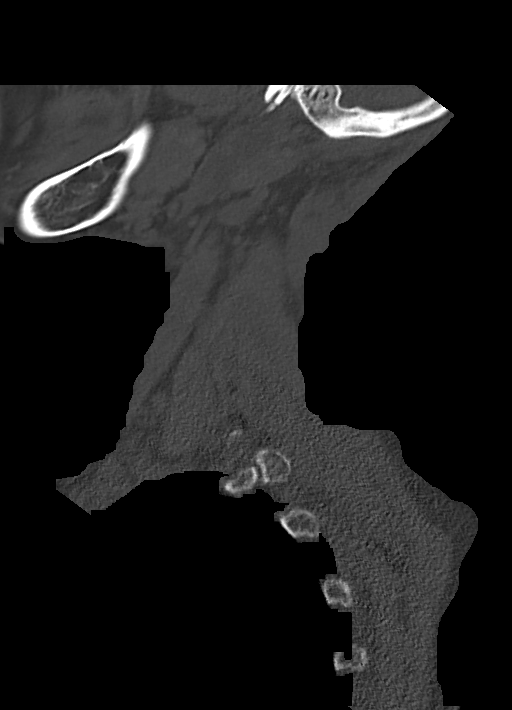

[Series 11: orthogonals · axial · 0.29mm/px · z∈[-592,-477]mm · 5 of 110 slices shown, 7 images]
[im 19/110  soft-tissue]
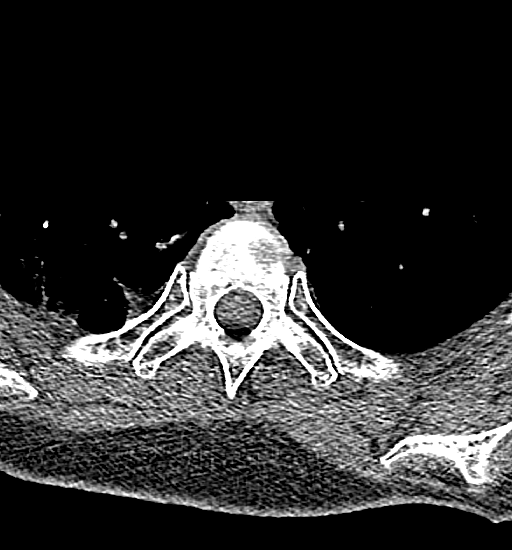
[im 19/110  bone]
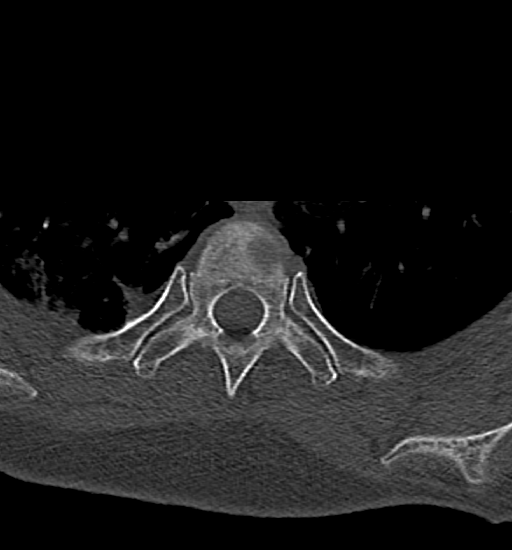
[im 37/110  bone]
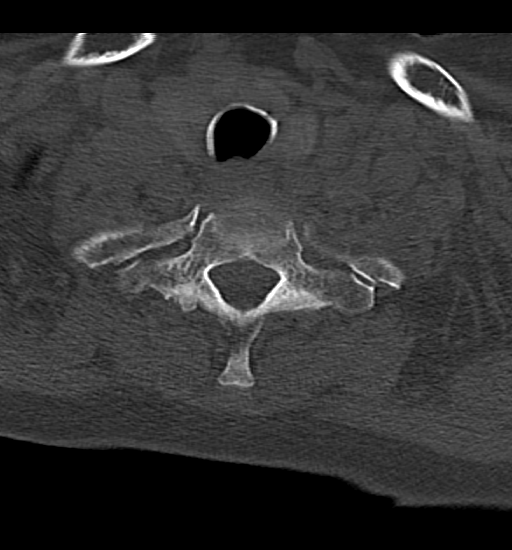
[im 55/110  bone]
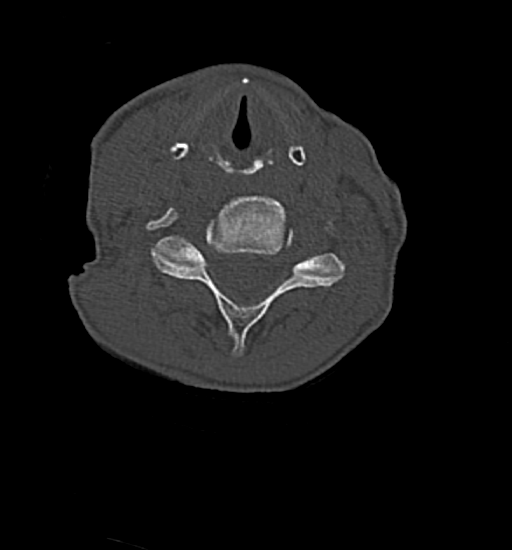
[im 73/110  bone]
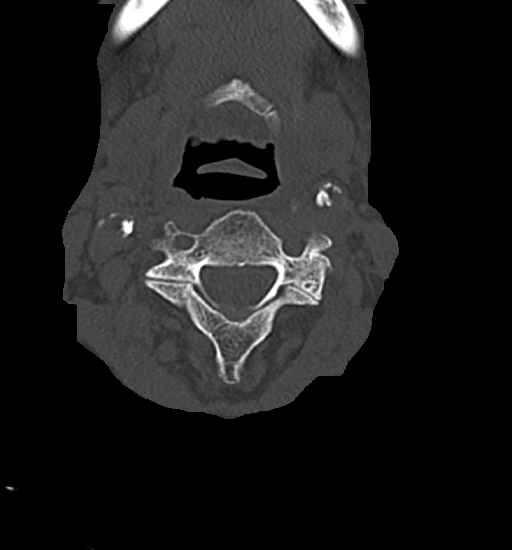
[im 91/110  soft-tissue]
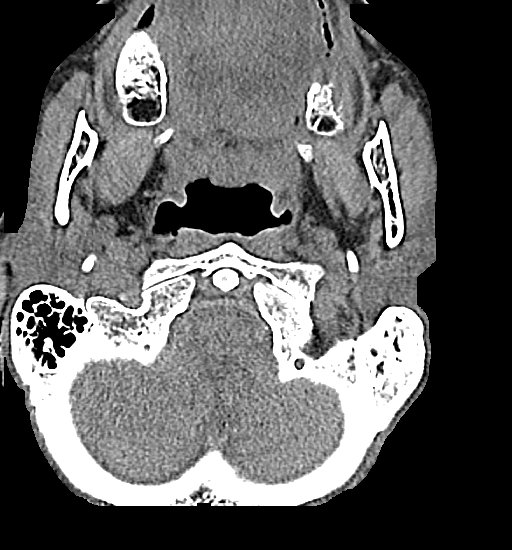
[im 91/110  bone]
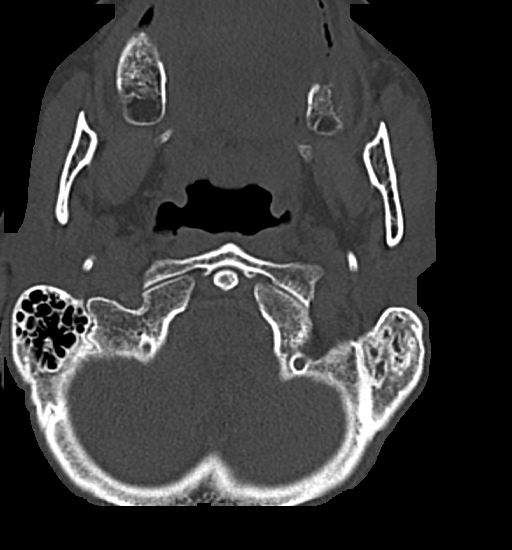

[14 of 33 positions shown; findings below may reference images not displayed]

FINDINGS: CT HEAD FINDINGS

Brain: Mild atrophic changes and chronic white matter ischemic
changes are seen. No findings to suggest acute hemorrhage, acute
infarction or space-occupying mass lesion are seen.

Vascular: Changes of prior aneurysm clipping are noted on the left
stable from the prior exam.

Skull: Changes of prior craniotomy on the left are seen consistent
with the aneurysm clipping. No acute abnormality in the skull is
noted.

Sinuses/Orbits: Mild fluid levels are noted within the paranasal
sinuses stable from the previous exam.

Other: None.

CT CERVICAL SPINE FINDINGS

Alignment: Within normal limits.

Skull base and vertebrae: 7 cervical segments are well visualized.
Vertebral body height is well maintained. No compression deformities
are seen. Facet hypertrophic changes are noted. No acute fracture or
acute facet abnormality is seen.

Soft tissues and spinal canal: Vascular calcifications are noted. No
gross soft tissue abnormality is identified.

Upper chest: Scarring is noted in the upper chest bilaterally stable
from previous exam.

Other: None
IMPRESSION: CT of the head: Chronic atrophic and ischemic changes.

No acute abnormality is noted.

Stable changes of prior aneurysm clipping near the terminal aspect
of the left internal carotid artery.

CT of the cervical spine: Multilevel degenerative change without
acute abnormality.

## 2019-11-03 ENCOUNTER — Ambulatory Visit (INDEPENDENT_AMBULATORY_CARE_PROVIDER_SITE_OTHER): Payer: Medicare HMO | Admitting: Cardiovascular Disease

## 2019-11-03 ENCOUNTER — Encounter: Payer: Self-pay | Admitting: Cardiovascular Disease

## 2019-11-03 ENCOUNTER — Other Ambulatory Visit: Payer: Self-pay

## 2019-11-03 VITALS — BP 178/74 | HR 67

## 2019-11-03 DIAGNOSIS — I739 Peripheral vascular disease, unspecified: Secondary | ICD-10-CM | POA: Diagnosis not present

## 2019-11-03 NOTE — Progress Notes (Signed)
11/03/2019 Sonya Le   [birth year]  [id number]  Primary Physician Sonya Raring, MD Primary Cardiologist: Sonya Gess MD FACP, [place], [place], [place]  HPI:  Sonya Le is a 64 y.o. thin, frail and chronically ill-appearing married Caucasian female mother of 1 child, grandmother 5 grandchildren is accompanied by her nephew Sonya Le today.  She is referred by Dr. Hanley Le for peripheral vascular valuation.  She does continue to smoke 1/2 pack/day which she is done for last 50 years.  She has untreated hypertension hyperlipidemia.  She apparently has had a stroke in the past and left her with left-sided weakness.  She is nonambulatory and basically sits in a chair all day.  She does have HIV on medications.  She denies chest pain or shortness of breath.  She had a right BKA performed at [facility].  She had recent Dopplers performed by Dr. Hanley Le that revealed a left ABI 0.24 with monophasic waveforms in the SFA down.  I cannot feel good pedal pulse.  There is no evidence of critical limb ischemia.  There is no open wound.  Given her lack of ambulation or critical limb ischemia I do not feel she is a good candidate for an invasive evaluation.   Current Meds  Medication Sig  . cilostazol (PLETAL) 100 MG tablet Take 100 mg by mouth 2 (two) times daily.  . dolutegravir (TIVICAY) 50 MG tablet Take 1 tablet (50 mg total) by mouth daily.  Marland Kitchen emtricitabine-rilpivir-tenofovir AF (ODEFSEY) 200-25-25 MG TABS tablet Take 1 tablet by mouth daily with breakfast.  . potassium chloride SA (K-DUR,KLOR-CON) 20 MEQ tablet Take 20 mEq by mouth 2 (two) times daily.  . [DISCONTINUED] amLODipine (NORVASC) 10 MG tablet Take 10 mg by mouth daily.   . [DISCONTINUED] lisinopril-hydrochlorothiazide (PRINZIDE,ZESTORETIC) 20-25 MG tablet Take 1 tablet by mouth daily.     Allergies  Allergen Reactions  . Penicillins   . Sulfa Antibiotics     Sulfonamide antibiotics  .  Sulfamethoxazole-Trimethoprim Rash    Social History   Socioeconomic History  . Marital status: Unknown    Spouse name: Not on file  . Number of children: Not on file  . Years of education: Not on file  . Highest education level: Not on file  Occupational History  . Not on file  Tobacco Use  . Smoking status: Current Every Day Smoker    Packs/day: 0.50    Types: Cigarettes  . Smokeless tobacco: Never Used  Substance and Sexual Activity  . Alcohol use: No  . Drug use: No  . Sexual activity: Not on file  Other Topics Concern  . Not on file  Social History Narrative  . Not on file   Social Determinants of Health   Financial Resource Strain:   . Difficulty of Paying Living Expenses:   Food Insecurity:   . Worried About Programme researcher, broadcasting/film/video in the Last Year:   . Barista in the Last Year:   Transportation Needs:   . Freight forwarder (Medical):   Marland Kitchen Lack of Transportation (Non-Medical):   Physical Activity:   . Days of Exercise per Week:   . Minutes of Exercise per Session:   Stress:   . Feeling of Stress :   Social Connections:   . Frequency of Communication with Friends and Family:   . Frequency of Social Gatherings with Friends and Family:   . Attends Religious Services:   . Active Member  of Clubs or Organizations:   . Attends Banker Meetings:   Marland Kitchen Marital Status:   Intimate Partner Violence:   . Fear of Current or Ex-Partner:   . Emotionally Abused:   Marland Kitchen Physically Abused:   . Sexually Abused:      Review of Systems: General: negative for chills, fever, night sweats or weight changes.  Cardiovascular: negative for chest pain, dyspnea on exertion, edema, orthopnea, palpitations, paroxysmal nocturnal dyspnea or shortness of breath Dermatological: negative for rash Respiratory: negative for cough or wheezing Urologic: negative for hematuria Abdominal: negative for nausea, vomiting, diarrhea, bright red blood per rectum, melena, or  hematemesis Neurologic: negative for visual changes, syncope, or dizziness All other systems reviewed and are otherwise negative except as noted above.    Blood pressure (!) 178/74, pulse 67, SpO2 99 %.  General appearance: alert and no distress Neck: no adenopathy, no carotid bruit, no JVD, supple, symmetrical, trachea midline and thyroid not enlarged, symmetric, no tenderness/mass/nodules Lungs: clear to auscultation bilaterally Heart: Soft diastolic murmur at the left upper sternal border Extremities: Right BKA Pulses: Absent left pedal pulse Skin: Skin color, texture, turgor normal. No rashes or lesions Neurologic: Alert and oriented X 3, normal strength and tone. Normal symmetric reflexes. Normal coordination and gait  EKG sinus rhythm at 67 with LVH voltage with repolarization changes and septal Q waves.  I personally reviewed this EKG.  ASSESSMENT AND PLAN:   Peripheral arterial disease (HCC) Ms. Sonya Le was referred to me by Dr. Hanley Le for PAD evaluation.  She is a 64 year old African-American female status post right BKA at [facility] 2/29/2019.  She is nonambulatory mostly sits in a chair all day.  There is no evidence of critical limb ischemia.  She had lower extremity arterial Dopplers performed by Dr. Hanley Le that showed monophasic waveforms from the SFA down with a left ABI 0.24.  I do not think she is a good candidate for invasive evaluation.      Sonya Gess MD FACP,FACC,FAHA, [facility] 11/03/2019 3:28 PM

## 2019-11-03 NOTE — Assessment & Plan Note (Signed)
Sonya Le was referred to me by Dr. Hanley Hays for PAD evaluation.  She is a 64 year old African-American female status post right BKA at [facility] 2/29/2019.  She is nonambulatory mostly sits in a chair all day.  There is no evidence of critical limb ischemia.  She had lower extremity arterial Dopplers performed by Dr. Hanley Hays that showed monophasic waveforms from the SFA down with a left ABI 0.24.  I do not think she is a good candidate for invasive evaluation.

## 2019-11-03 NOTE — Patient Instructions (Signed)
Medication Instructions:  Your physician recommends that you continue on your current medications as directed. Please refer to the Current Medication list given to you today.  Lab Work: NONE   Testing/Procedures: NONE   Follow-Up: AS NEEDED  

## 2020-10-11 ENCOUNTER — Other Ambulatory Visit: Payer: Self-pay | Admitting: Infectious Diseases

## 2020-10-11 DIAGNOSIS — Z21 Asymptomatic human immunodeficiency virus [HIV] infection status: Secondary | ICD-10-CM

## 2021-02-01 LAB — COLOGUARD: COLOGUARD: NEGATIVE

## 2022-09-30 ENCOUNTER — Telehealth: Payer: Self-pay

## 2022-09-30 NOTE — Telephone Encounter (Addendum)
Dental Referral Follow-up  Sonya Le  67 y.o., female    Referral Date: 08/09/22   Seen Date No show 09/18/22  Dental Contact: [facility] @ [phone]   Staff attempted to reach patient to provide her with [facility] contact info for rescheduling.  Phone number currently not in service.  Valarie Cones, LPN

## 2023-11-15 NOTE — Progress Notes (Signed)
 ------------------------------------------------------------------------------- Attestation signed by Zoaib Safdar Rasool, DO at 11/15/2023  4:17 PM I agree with the assessment plan as per APP below. -------------------------------------------------------------------------------    HOSPITAL MEDICINE -  PROGRESS NOTE  Hospital Day: #20   Admission Date:  10/25/2023   PCP:  Montie Jenkins Lunger, NP   Extended Emergency Contact Information Primary Emergency Contact: Cannon,Bobby Mobile Phone: [phone] Relation: [facility] Course: Sonya Le is a 68 y.o. female with a PMHx of HIV, severe malnutrition, HTN, tobacco use disorder that presented to [facility] 10/26/2023 with complaint of left-sided abdominal pain x 2 weeks. RVP, nasal MRSA screen were both negative. She underwent CTA of the chest which was negative for PE but revealed left lower lobe bronchopneumonia with intrapulmonary abscess for which pt was admitted. SLP consulted was found to have aspiration with all food consistencies, EGD done 10/30/2023 noted to have gastritis and underwent a esophageal dilation at that time. Due to NPO status, tube feedings were started via NG tube with plan for PEG placement.  Patient has been seen by ID and continues on IV abx per their recommendations.     Subjective   Patient lying in bed, in no acute distress.  SLP plans for MBS on Monday.  In the meantime, patient to continue with ice chips, see note.  Patient denies other concerns or complaints.  No chest pain or shortness of breath.  Assessment/Plan   Assessment & Plan Abscess of lower lobe of left lung with pneumonia    (CMD) Blood cultures x 2 and urine cultures are negative.  RVP, nasal MRSA screen are negative. CT chest negative for PE. Did show left lower lobe bronchopneumonia with intrapulmonary abscess. CXR 7/16: Concern for right nodular opacity Plan:  - repeat CT chest in 8 weeks to exclude  malignant mass  - ID consulted. As patient with new right nodular opacity on CXR 7/16 and continued productive cough, recommend broadening antibiotics to cefepime and metronidazole IV until 11/23/23 - N.p.o. due to dysphagia and aspiration   Oropharyngeal dysphagia At high risk for aspiration Failed bedside swallow evaluation.  Noted to have aspiration on MBS. EGD on 7/3, noted to have gastritis, received esophageal dilatation. Plan:   - SLP consulted, recommend NPO, NG tube feedings  - IR consulted for GJ tube placement, however is poor candidate given colon anatomy - EGS consulted for possible J tube placement, recommended holding off with J-tube until SLP has had the chance to work more with the patient. - RD consulted to manage tube feeds -Throat lozenges as needed - discussed possible d/c to LTAC w/ CM for continued NG tube feeds and IV abx. CM is working on this.  Severe protein-calorie malnutrition Gregoria: less than 60% of standard weight) (HCC) Failure to thrive in adult - RD consulted - thiamine oral daily  Physical debility TSH is normal at 1.5. Human immunodeficiency virus (HIV) disease (HCC) Last CD4 count was 793 on 02/17/2023.  Repeat CD4 count is 637 on 10/27/2023.  - Continue Tivicay  and Truvada  PAD (peripheral artery disease) (HCC) - Continue Lipitor and ASA Hypomagnesemia -continue Magnesium oxide 400 mg per NGT TID Enlarged Hilar lymph node - radiologist recommending follow up outpatient imaging for monitoring  Nutrition Status: Severe protein-calorie malnutrition, in the context of Chronic illness, has been identified by the Registered Dietitian. See last RD note for recommendations. Nutrition Intervention: Continue current nutrition regimen, Enteral nutrition   Agree with findings:  Yes  DVT Prophylaxis: Lovenox SubQ  Active Medications: emtricitabine-tenofovir (TDF), 1 tablet, oral, Daily aspirin, 81 mg, nasogastric tube, Daily atorvastatin, 10  mg, Nasogastric, At Bedtime cefepime, 2 g, intravenous, Q8H dolutegravir , 50 mg, oral, Daily enoxaparin, 30 mg, subcutaneous, At Bedtime free water, 50 mL, Nasogastric, Q6H SCH ipratropium-albuteroL, 3 mL, nebulization, Q6H SCH lansoprazole, 15 mg, Nasogastric, QAM AC magnesium oxide, 400 mg, Nasogastric, TID metroNIDAZOLE, 500 mg, Nasogastric, TID polyethylene glycol, 17 g, Nasogastric, Daily scopolamine, 1 patch, topical, Q72H senna, 1 tablet, Nasogastric, BID thiamine, 100 mg, Nasogastric, Daily   Infusions: Osmolite 1.5, , Last Rate: 45 mL/hr at 11/15/23 1237   Objective   Temp:  [97.7 F (36.5 C)-98.8 F (37.1 C)] 98.8 F (37.1 C) Heart Rate:  [61-79] 78 Resp:  [17-19] 18 BP: (114-139)/(50-62) 114/58 O2 Flow Rate (L/min): 3 L/min  General: Patient with severe cachexia. Lying in the bed in no acute distress. Oriented to person, place, and time.  HEENT: Head is atraumatic and normocephalic. Extra ocular movements are intact. Sclera is anicteric.  Respiratory: Lungs are clear to auscultation bilaterally with normal respiratory effort. Cardiovascular: Regular rate and rhythm. No murmurs, rubs, or gallops. No lower extremity edema.  Gastrointestinal: Abdomen is soft, non-tender, non-distended, and without masses. Normal bowel sounds.  Skin: warm and dry. No rashes or ulcerations noted on exposed arms and legs.      Labs   Results from last 2 days  Lab Units 11/15/23 0526 11/14/23 0200  WHITE BLOOD CELL COUNT 10*3/uL 4.56 4.71  HEMOGLOBIN g/dL 88.7* 88.7*  HEMATOCRIT % 33.7* 33.7*  PLATELET COUNT 10*3/uL 218 198    Results from last 2 days  Lab Units 11/15/23 0526 11/14/23 0200  SODIUM mmol/L 139 139  POTASSIUM mmol/L 4.3 3.5  CHLORIDE mmol/L 104 105  CO2 mmol/L 32* 32*  BUN mg/dL 13 11  CREATININE mg/dL <9.79* <9.79*  GLUCOSE mg/dL 91 885*  CALCIUM mg/dL 8.6 8.4*    Results from last 2 days  Lab Units 11/15/23 0526 11/14/23 0200  MAGNESIUM mg/dL 2.1  1.8*      Vicenta Fairy Deputy, PA-C

## 2023-11-23 NOTE — Progress Notes (Addendum)
 Case Management Update  Date: 11/23/2023   Time: 10:19 AM   Patient Type: Inpatient  CSW has reached out to [place] with [facility] on any updates regarding appeal of the denial by insurance Pending response  Update: Damien reports no new updates, she reports anticipate update by end of day tomorrow Monday 7/28   Case Management Coordination Status: Coordination In-Progress    Anticipated Discharge Location: To be determined  Rosina CHRISTELLA Fitting, MSW

## 2024-03-29 DEATH — deceased
